# Patient Record
Sex: Female | Born: 1950 | Race: White | Hispanic: No | Marital: Married
Health system: Southern US, Community
[De-identification: ages and names within clinical notes are randomized; demographics above are authoritative.]

---

## 2016-12-24 ENCOUNTER — Inpatient Hospital Stay
Admission: AD | Admit: 2016-12-24 | Discharge: 2017-03-30 | Disposition: E | Payer: Self-pay | Source: Ambulatory Visit | Attending: Internal Medicine | Admitting: Internal Medicine

## 2016-12-24 DIAGNOSIS — R0902 Hypoxemia: Secondary | ICD-10-CM

## 2016-12-24 DIAGNOSIS — Z931 Gastrostomy status: Secondary | ICD-10-CM

## 2016-12-24 DIAGNOSIS — J9690 Respiratory failure, unspecified, unspecified whether with hypoxia or hypercapnia: Secondary | ICD-10-CM

## 2016-12-24 DIAGNOSIS — I509 Heart failure, unspecified: Secondary | ICD-10-CM

## 2016-12-24 DIAGNOSIS — T148XXA Other injury of unspecified body region, initial encounter: Secondary | ICD-10-CM

## 2016-12-24 DIAGNOSIS — Z9911 Dependence on respirator [ventilator] status: Secondary | ICD-10-CM

## 2016-12-24 DIAGNOSIS — J189 Pneumonia, unspecified organism: Secondary | ICD-10-CM

## 2016-12-24 DIAGNOSIS — J90 Pleural effusion, not elsewhere classified: Secondary | ICD-10-CM

## 2016-12-24 DIAGNOSIS — R109 Unspecified abdominal pain: Secondary | ICD-10-CM

## 2016-12-25 ENCOUNTER — Other Ambulatory Visit (HOSPITAL_COMMUNITY): Payer: Self-pay

## 2016-12-25 LAB — COMPREHENSIVE METABOLIC PANEL
ALT: 68 U/L — ABNORMAL HIGH (ref 14–54)
AST: 106 U/L — ABNORMAL HIGH (ref 15–41)
Albumin: 1.7 g/dL — ABNORMAL LOW (ref 3.5–5.0)
Alkaline Phosphatase: 370 U/L — ABNORMAL HIGH (ref 38–126)
BUN: 46 mg/dL — ABNORMAL HIGH (ref 6–20)
CO2: 20 mmol/L — ABNORMAL LOW (ref 22–32)
Calcium: 8.3 mg/dL — ABNORMAL LOW (ref 8.9–10.3)
Chloride: >130 mmol/L (ref 101–111)
Creatinine, Ser: 1.61 mg/dL — ABNORMAL HIGH (ref 0.44–1.00)
GFR calc Af Amer: 37 mL/min — ABNORMAL LOW (ref >60)
GFR calc non Af Amer: 32 mL/min — ABNORMAL LOW (ref >60)
Glucose, Bld: 143 mg/dL — ABNORMAL HIGH (ref 65–99)
Potassium: 3.9 mmol/L (ref 3.5–5.1)
Sodium: 159 mmol/L — ABNORMAL HIGH (ref 135–145)
Total Bilirubin: 0.5 mg/dL (ref 0.3–1.2)
Total Protein: 5.1 g/dL — ABNORMAL LOW (ref 6.5–8.1)

## 2016-12-25 LAB — PROTIME-INR
INR: 1.23
Prothrombin Time: 15.5 seconds — ABNORMAL HIGH (ref 11.4–15.2)

## 2016-12-25 LAB — BASIC METABOLIC PANEL
ANION GAP: 9 (ref 5–15)
BUN: 42 mg/dL — AB (ref 6–20)
CALCIUM: 7.9 mg/dL — AB (ref 8.9–10.3)
CO2: 20 mmol/L — AB (ref 22–32)
CREATININE: 1.48 mg/dL — AB (ref 0.44–1.00)
Chloride: 122 mmol/L — ABNORMAL HIGH (ref 101–111)
GFR calc Af Amer: 41 mL/min — ABNORMAL LOW (ref >60)
GFR, EST NON AFRICAN AMERICAN: 35 mL/min — AB (ref >60)
GLUCOSE: 292 mg/dL — AB (ref 65–99)
Potassium: 3.5 mmol/L (ref 3.5–5.1)
Sodium: 151 mmol/L — ABNORMAL HIGH (ref 135–145)

## 2016-12-25 LAB — CBC
HCT: 29.9 % — ABNORMAL LOW (ref 36.0–46.0)
Hemoglobin: 9 g/dL — ABNORMAL LOW (ref 12.0–15.0)
MCH: 28.9 pg (ref 26.0–34.0)
MCHC: 30.1 g/dL (ref 30.0–36.0)
MCV: 96.1 fL (ref 78.0–100.0)
Platelets: 213 10*3/uL (ref 150–400)
RBC: 3.11 MIL/uL — ABNORMAL LOW (ref 3.87–5.11)
RDW: 18.5 % — ABNORMAL HIGH (ref 11.5–15.5)
WBC: 16.3 10*3/uL — ABNORMAL HIGH (ref 4.0–10.5)

## 2016-12-25 LAB — MAGNESIUM: Magnesium: 2.4 mg/dL (ref 1.7–2.4)

## 2016-12-25 LAB — APTT: aPTT: 30 seconds (ref 24–36)

## 2016-12-26 LAB — CBC
HEMATOCRIT: 29.7 % — AB (ref 36.0–46.0)
Hemoglobin: 8.5 g/dL — ABNORMAL LOW (ref 12.0–15.0)
MCH: 27.4 pg (ref 26.0–34.0)
MCHC: 28.6 g/dL — AB (ref 30.0–36.0)
MCV: 95.8 fL (ref 78.0–100.0)
Platelets: 230 10*3/uL (ref 150–400)
RBC: 3.1 MIL/uL — ABNORMAL LOW (ref 3.87–5.11)
RDW: 18.2 % — AB (ref 11.5–15.5)
WBC: 15.3 10*3/uL — ABNORMAL HIGH (ref 4.0–10.5)

## 2016-12-26 LAB — BASIC METABOLIC PANEL
Anion gap: 4 — ABNORMAL LOW (ref 5–15)
BUN: 41 mg/dL — ABNORMAL HIGH (ref 6–20)
BUN: 36 mg/dL — AB (ref 6–20)
CALCIUM: 8.1 mg/dL — AB (ref 8.9–10.3)
CALCIUM: 7.8 mg/dL — AB (ref 8.9–10.3)
CHLORIDE: 129 mmol/L — AB (ref 101–111)
CO2: 21 mmol/L — AB (ref 22–32)
CO2: 21 mmol/L — ABNORMAL LOW (ref 22–32)
CREATININE: 1.35 mg/dL — AB (ref 0.44–1.00)
CREATININE: 1.29 mg/dL — AB (ref 0.44–1.00)
GFR calc non Af Amer: 40 mL/min — ABNORMAL LOW (ref >60)
GFR calc non Af Amer: 42 mL/min — ABNORMAL LOW (ref >60)
GFR, EST AFRICAN AMERICAN: 46 mL/min — AB (ref >60)
GFR, EST AFRICAN AMERICAN: 49 mL/min — AB (ref >60)
GLUCOSE: 158 mg/dL — AB (ref 65–99)
Glucose, Bld: 202 mg/dL — ABNORMAL HIGH (ref 65–99)
Potassium: 3.4 mmol/L — ABNORMAL LOW (ref 3.5–5.1)
Potassium: 3.6 mmol/L (ref 3.5–5.1)
Sodium: 159 mmol/L — ABNORMAL HIGH (ref 135–145)
Sodium: 154 mmol/L — ABNORMAL HIGH (ref 135–145)

## 2016-12-27 LAB — BASIC METABOLIC PANEL
ANION GAP: 6 (ref 5–15)
ANION GAP: 6 (ref 5–15)
BUN: 39 mg/dL — ABNORMAL HIGH (ref 6–20)
BUN: 37 mg/dL — AB (ref 6–20)
CHLORIDE: 126 mmol/L — AB (ref 101–111)
CO2: 21 mmol/L — AB (ref 22–32)
CO2: 22 mmol/L (ref 22–32)
Calcium: 7.9 mg/dL — ABNORMAL LOW (ref 8.9–10.3)
Calcium: 7.8 mg/dL — ABNORMAL LOW (ref 8.9–10.3)
Chloride: 127 mmol/L — ABNORMAL HIGH (ref 101–111)
Creatinine, Ser: 1.34 mg/dL — ABNORMAL HIGH (ref 0.44–1.00)
Creatinine, Ser: 1.39 mg/dL — ABNORMAL HIGH (ref 0.44–1.00)
GFR calc Af Amer: 46 mL/min — ABNORMAL LOW (ref >60)
GFR calc Af Amer: 44 mL/min — ABNORMAL LOW (ref >60)
GFR calc non Af Amer: 40 mL/min — ABNORMAL LOW (ref >60)
GFR calc non Af Amer: 38 mL/min — ABNORMAL LOW (ref >60)
GLUCOSE: 221 mg/dL — AB (ref 65–99)
GLUCOSE: 337 mg/dL — AB (ref 65–99)
POTASSIUM: 3.4 mmol/L — AB (ref 3.5–5.1)
POTASSIUM: 3.6 mmol/L (ref 3.5–5.1)
Sodium: 154 mmol/L — ABNORMAL HIGH (ref 135–145)
Sodium: 154 mmol/L — ABNORMAL HIGH (ref 135–145)

## 2016-12-27 LAB — URINALYSIS, ROUTINE W REFLEX MICROSCOPIC
Bilirubin Urine: NEGATIVE
Hgb urine dipstick: NEGATIVE
KETONES UR: NEGATIVE mg/dL
LEUKOCYTES UA: NEGATIVE
Nitrite: NEGATIVE
PROTEIN: 30 mg/dL — AB
Specific Gravity, Urine: 1.015 (ref 1.005–1.030)
pH: 5 (ref 5.0–8.0)

## 2016-12-28 LAB — BASIC METABOLIC PANEL
ANION GAP: 4 — AB (ref 5–15)
BUN: 32 mg/dL — ABNORMAL HIGH (ref 6–20)
CALCIUM: 7.3 mg/dL — AB (ref 8.9–10.3)
CHLORIDE: 116 mmol/L — AB (ref 101–111)
CO2: 23 mmol/L (ref 22–32)
CREATININE: 1.24 mg/dL — AB (ref 0.44–1.00)
GFR calc Af Amer: 51 mL/min — ABNORMAL LOW (ref >60)
GFR calc non Af Amer: 44 mL/min — ABNORMAL LOW (ref >60)
GLUCOSE: 547 mg/dL — AB (ref 65–99)
Potassium: 3.3 mmol/L — ABNORMAL LOW (ref 3.5–5.1)
Sodium: 143 mmol/L (ref 135–145)

## 2016-12-28 LAB — C DIFFICILE QUICK SCREEN W PCR REFLEX
C DIFFICILE (CDIFF) TOXIN: NEGATIVE
C DIFFICLE (CDIFF) ANTIGEN: POSITIVE — AB

## 2016-12-28 LAB — MAGNESIUM: MAGNESIUM: 2.2 mg/dL (ref 1.7–2.4)

## 2016-12-28 LAB — CBC
HCT: 28.9 % — ABNORMAL LOW (ref 36.0–46.0)
Hemoglobin: 8.3 g/dL — ABNORMAL LOW (ref 12.0–15.0)
MCH: 27.8 pg (ref 26.0–34.0)
MCHC: 28.7 g/dL — AB (ref 30.0–36.0)
MCV: 96.7 fL (ref 78.0–100.0)
PLATELETS: 214 10*3/uL (ref 150–400)
RBC: 2.99 MIL/uL — ABNORMAL LOW (ref 3.87–5.11)
RDW: 18.2 % — ABNORMAL HIGH (ref 11.5–15.5)
WBC: 16.2 10*3/uL — ABNORMAL HIGH (ref 4.0–10.5)

## 2016-12-28 LAB — PHOSPHORUS: Phosphorus: 3.3 mg/dL (ref 2.5–4.6)

## 2016-12-28 LAB — CLOSTRIDIUM DIFFICILE BY PCR: Toxigenic C. Difficile by PCR: NEGATIVE

## 2016-12-28 DEATH — deceased

## 2016-12-29 LAB — BASIC METABOLIC PANEL
Anion gap: 5 (ref 5–15)
BUN: 26 mg/dL — ABNORMAL HIGH (ref 6–20)
CO2: 26 mmol/L (ref 22–32)
CREATININE: 1.2 mg/dL — AB (ref 0.44–1.00)
Calcium: 8.2 mg/dL — ABNORMAL LOW (ref 8.9–10.3)
Chloride: 121 mmol/L — ABNORMAL HIGH (ref 101–111)
GFR calc Af Amer: 53 mL/min — ABNORMAL LOW (ref >60)
GFR, EST NON AFRICAN AMERICAN: 46 mL/min — AB (ref >60)
Glucose, Bld: 233 mg/dL — ABNORMAL HIGH (ref 65–99)
POTASSIUM: 4.1 mmol/L (ref 3.5–5.1)
SODIUM: 152 mmol/L — AB (ref 135–145)

## 2016-12-29 LAB — URINE CULTURE: Culture: 80000 — AB

## 2016-12-30 LAB — BASIC METABOLIC PANEL
ANION GAP: 6 (ref 5–15)
BUN: 24 mg/dL — ABNORMAL HIGH (ref 6–20)
CHLORIDE: 112 mmol/L — AB (ref 101–111)
CO2: 26 mmol/L (ref 22–32)
Calcium: 7.7 mg/dL — ABNORMAL LOW (ref 8.9–10.3)
Creatinine, Ser: 1.19 mg/dL — ABNORMAL HIGH (ref 0.44–1.00)
GFR calc non Af Amer: 46 mL/min — ABNORMAL LOW (ref >60)
GFR, EST AFRICAN AMERICAN: 54 mL/min — AB (ref >60)
GLUCOSE: 216 mg/dL — AB (ref 65–99)
Potassium: 3.8 mmol/L (ref 3.5–5.1)
Sodium: 144 mmol/L (ref 135–145)

## 2016-12-30 LAB — CBC WITH DIFFERENTIAL/PLATELET
Basophils Absolute: 0 10*3/uL (ref 0.0–0.1)
Basophils Relative: 0 %
EOS ABS: 0.4 10*3/uL (ref 0.0–0.7)
Eosinophils Relative: 3 %
HCT: 29.4 % — ABNORMAL LOW (ref 36.0–46.0)
HEMOGLOBIN: 8.4 g/dL — AB (ref 12.0–15.0)
LYMPHS ABS: 1 10*3/uL (ref 0.7–4.0)
LYMPHS PCT: 7 %
MCH: 27.5 pg (ref 26.0–34.0)
MCHC: 28.6 g/dL — AB (ref 30.0–36.0)
MCV: 96.4 fL (ref 78.0–100.0)
Monocytes Absolute: 0.5 10*3/uL (ref 0.1–1.0)
Monocytes Relative: 3 %
NEUTROS ABS: 12.3 10*3/uL — AB (ref 1.7–7.7)
NEUTROS PCT: 87 %
Platelets: 205 10*3/uL (ref 150–400)
RBC: 3.05 MIL/uL — AB (ref 3.87–5.11)
RDW: 17.8 % — ABNORMAL HIGH (ref 11.5–15.5)
WBC: 14.1 10*3/uL — ABNORMAL HIGH (ref 4.0–10.5)

## 2016-12-30 LAB — MAGNESIUM: Magnesium: 2 mg/dL (ref 1.7–2.4)

## 2016-12-30 LAB — PHOSPHORUS: Phosphorus: 3.5 mg/dL (ref 2.5–4.6)

## 2016-12-31 LAB — RENAL FUNCTION PANEL
ALBUMIN: 1.9 g/dL — AB (ref 3.5–5.0)
Anion gap: 6 (ref 5–15)
BUN: 26 mg/dL — ABNORMAL HIGH (ref 6–20)
CALCIUM: 7.7 mg/dL — AB (ref 8.9–10.3)
CO2: 28 mmol/L (ref 22–32)
CREATININE: 1.21 mg/dL — AB (ref 0.44–1.00)
Chloride: 110 mmol/L (ref 101–111)
GFR calc Af Amer: 52 mL/min — ABNORMAL LOW (ref >60)
GFR, EST NON AFRICAN AMERICAN: 45 mL/min — AB (ref >60)
Glucose, Bld: 180 mg/dL — ABNORMAL HIGH (ref 65–99)
PHOSPHORUS: 3.6 mg/dL (ref 2.5–4.6)
Potassium: 3.6 mmol/L (ref 3.5–5.1)
SODIUM: 144 mmol/L (ref 135–145)

## 2016-12-31 LAB — MAGNESIUM: MAGNESIUM: 2 mg/dL (ref 1.7–2.4)

## 2016-12-31 LAB — HEMOGLOBIN A1C
HEMOGLOBIN A1C: 6.3 % — AB (ref 4.8–5.6)
MEAN PLASMA GLUCOSE: 134 mg/dL

## 2017-01-03 ENCOUNTER — Other Ambulatory Visit (HOSPITAL_COMMUNITY): Payer: Self-pay

## 2017-01-03 LAB — CBC WITH DIFFERENTIAL/PLATELET
BASOS ABS: 0.1 10*3/uL (ref 0.0–0.1)
Basophils Relative: 0 %
Eosinophils Absolute: 0 10*3/uL (ref 0.0–0.7)
Eosinophils Relative: 0 %
HEMATOCRIT: 29.2 % — AB (ref 36.0–46.0)
HEMOGLOBIN: 8.9 g/dL — AB (ref 12.0–15.0)
LYMPHS PCT: 7 %
Lymphs Abs: 1 10*3/uL (ref 0.7–4.0)
MCH: 28.7 pg (ref 26.0–34.0)
MCHC: 30.5 g/dL (ref 30.0–36.0)
MCV: 94.2 fL (ref 78.0–100.0)
MONO ABS: 0.5 10*3/uL (ref 0.1–1.0)
MONOS PCT: 4 %
NEUTROS ABS: 12.4 10*3/uL — AB (ref 1.7–7.7)
NEUTROS PCT: 89 %
Platelets: 171 10*3/uL (ref 150–400)
RBC: 3.1 MIL/uL — ABNORMAL LOW (ref 3.87–5.11)
RDW: 18.1 % — AB (ref 11.5–15.5)
WBC: 14 10*3/uL — ABNORMAL HIGH (ref 4.0–10.5)

## 2017-01-03 LAB — RENAL FUNCTION PANEL
ALBUMIN: 2.1 g/dL — AB (ref 3.5–5.0)
ANION GAP: 10 (ref 5–15)
BUN: 25 mg/dL — ABNORMAL HIGH (ref 6–20)
CALCIUM: 7.9 mg/dL — AB (ref 8.9–10.3)
CO2: 31 mmol/L (ref 22–32)
Chloride: 105 mmol/L (ref 101–111)
Creatinine, Ser: 1.1 mg/dL — ABNORMAL HIGH (ref 0.44–1.00)
GFR calc non Af Amer: 51 mL/min — ABNORMAL LOW (ref >60)
GFR, EST AFRICAN AMERICAN: 59 mL/min — AB (ref >60)
GLUCOSE: 158 mg/dL — AB (ref 65–99)
PHOSPHORUS: 4.4 mg/dL (ref 2.5–4.6)
POTASSIUM: 3.9 mmol/L (ref 3.5–5.1)
SODIUM: 146 mmol/L — AB (ref 135–145)

## 2017-01-03 LAB — MAGNESIUM: Magnesium: 2 mg/dL (ref 1.7–2.4)

## 2017-01-04 LAB — BASIC METABOLIC PANEL
Anion gap: 8 (ref 5–15)
BUN: 26 mg/dL — ABNORMAL HIGH (ref 6–20)
CALCIUM: 7.8 mg/dL — AB (ref 8.9–10.3)
CO2: 35 mmol/L — AB (ref 22–32)
CREATININE: 1.17 mg/dL — AB (ref 0.44–1.00)
Chloride: 101 mmol/L (ref 101–111)
GFR calc non Af Amer: 47 mL/min — ABNORMAL LOW (ref >60)
GFR, EST AFRICAN AMERICAN: 55 mL/min — AB (ref >60)
GLUCOSE: 167 mg/dL — AB (ref 65–99)
Potassium: 3.6 mmol/L (ref 3.5–5.1)
SODIUM: 144 mmol/L (ref 135–145)

## 2017-01-05 LAB — BASIC METABOLIC PANEL
Anion gap: 8 (ref 5–15)
BUN: 25 mg/dL — AB (ref 6–20)
CO2: 37 mmol/L — AB (ref 22–32)
Calcium: 7.8 mg/dL — ABNORMAL LOW (ref 8.9–10.3)
Chloride: 99 mmol/L — ABNORMAL LOW (ref 101–111)
Creatinine, Ser: 1.18 mg/dL — ABNORMAL HIGH (ref 0.44–1.00)
GFR calc Af Amer: 54 mL/min — ABNORMAL LOW (ref >60)
GFR, EST NON AFRICAN AMERICAN: 47 mL/min — AB (ref >60)
GLUCOSE: 168 mg/dL — AB (ref 65–99)
Potassium: 3.5 mmol/L (ref 3.5–5.1)
Sodium: 144 mmol/L (ref 135–145)

## 2017-01-05 LAB — CBC
HCT: 29.1 % — ABNORMAL LOW (ref 36.0–46.0)
Hemoglobin: 8.4 g/dL — ABNORMAL LOW (ref 12.0–15.0)
MCH: 28 pg (ref 26.0–34.0)
MCHC: 28.9 g/dL — AB (ref 30.0–36.0)
MCV: 97 fL (ref 78.0–100.0)
Platelets: 163 10*3/uL (ref 150–400)
RBC: 3 MIL/uL — ABNORMAL LOW (ref 3.87–5.11)
RDW: 18 % — AB (ref 11.5–15.5)
WBC: 12.4 10*3/uL — ABNORMAL HIGH (ref 4.0–10.5)

## 2017-01-05 LAB — PHOSPHORUS: Phosphorus: 4.2 mg/dL (ref 2.5–4.6)

## 2017-01-05 LAB — MAGNESIUM: MAGNESIUM: 2.1 mg/dL (ref 1.7–2.4)

## 2017-01-09 LAB — BASIC METABOLIC PANEL
ANION GAP: 8 (ref 5–15)
BUN: 22 mg/dL — ABNORMAL HIGH (ref 6–20)
CHLORIDE: 97 mmol/L — AB (ref 101–111)
CO2: 37 mmol/L — ABNORMAL HIGH (ref 22–32)
Calcium: 8.1 mg/dL — ABNORMAL LOW (ref 8.9–10.3)
Creatinine, Ser: 1.09 mg/dL — ABNORMAL HIGH (ref 0.44–1.00)
GFR calc Af Amer: 59 mL/min — ABNORMAL LOW (ref >60)
GFR, EST NON AFRICAN AMERICAN: 51 mL/min — AB (ref >60)
Glucose, Bld: 159 mg/dL — ABNORMAL HIGH (ref 65–99)
POTASSIUM: 3.7 mmol/L (ref 3.5–5.1)
SODIUM: 142 mmol/L (ref 135–145)

## 2017-01-10 ENCOUNTER — Other Ambulatory Visit (HOSPITAL_COMMUNITY): Payer: Self-pay

## 2017-01-10 LAB — PARATHYROID HORMONE, INTACT (NO CA): PTH: 33 pg/mL (ref 15–65)

## 2017-01-11 LAB — CBC
HCT: 28.6 % — ABNORMAL LOW (ref 36.0–46.0)
Hemoglobin: 8.4 g/dL — ABNORMAL LOW (ref 12.0–15.0)
MCH: 27.7 pg (ref 26.0–34.0)
MCHC: 29.4 g/dL — ABNORMAL LOW (ref 30.0–36.0)
MCV: 94.4 fL (ref 78.0–100.0)
Platelets: 165 10*3/uL (ref 150–400)
RBC: 3.03 MIL/uL — ABNORMAL LOW (ref 3.87–5.11)
RDW: 17.7 % — ABNORMAL HIGH (ref 11.5–15.5)
WBC: 12.7 10*3/uL — ABNORMAL HIGH (ref 4.0–10.5)

## 2017-01-11 LAB — BASIC METABOLIC PANEL WITH GFR
Anion gap: 11 (ref 5–15)
BUN: 25 mg/dL — ABNORMAL HIGH (ref 6–20)
CO2: 36 mmol/L — ABNORMAL HIGH (ref 22–32)
Calcium: 8 mg/dL — ABNORMAL LOW (ref 8.9–10.3)
Chloride: 93 mmol/L — ABNORMAL LOW (ref 101–111)
Creatinine, Ser: 1.25 mg/dL — ABNORMAL HIGH (ref 0.44–1.00)
GFR calc Af Amer: 50 mL/min — ABNORMAL LOW
GFR calc non Af Amer: 43 mL/min — ABNORMAL LOW
Glucose, Bld: 174 mg/dL — ABNORMAL HIGH (ref 65–99)
Potassium: 3.6 mmol/L (ref 3.5–5.1)
Sodium: 140 mmol/L (ref 135–145)

## 2017-01-11 LAB — PHOSPHORUS: Phosphorus: 5.6 mg/dL — ABNORMAL HIGH (ref 2.5–4.6)

## 2017-01-11 LAB — MAGNESIUM: Magnesium: 2.1 mg/dL (ref 1.7–2.4)

## 2017-01-12 LAB — CALCIUM, IONIZED: CALCIUM, IONIZED, SERUM: 4.6 mg/dL (ref 4.5–5.6)

## 2017-01-13 LAB — C DIFFICILE QUICK SCREEN W PCR REFLEX
C DIFFICILE (CDIFF) INTERP: NOT DETECTED
C DIFFICILE (CDIFF) TOXIN: NEGATIVE
C DIFFICLE (CDIFF) ANTIGEN: NEGATIVE

## 2017-01-14 ENCOUNTER — Other Ambulatory Visit (HOSPITAL_COMMUNITY): Payer: Self-pay

## 2017-01-14 LAB — CBC WITH DIFFERENTIAL/PLATELET
Basophils Absolute: 0 10*3/uL (ref 0.0–0.1)
Basophils Relative: 0 %
EOS ABS: 0.5 10*3/uL (ref 0.0–0.7)
Eosinophils Relative: 4 %
HCT: 28.8 % — ABNORMAL LOW (ref 36.0–46.0)
Hemoglobin: 8.3 g/dL — ABNORMAL LOW (ref 12.0–15.0)
LYMPHS ABS: 0.9 10*3/uL (ref 0.7–4.0)
LYMPHS PCT: 8 %
MCH: 27.7 pg (ref 26.0–34.0)
MCHC: 28.8 g/dL — AB (ref 30.0–36.0)
MCV: 96 fL (ref 78.0–100.0)
MONO ABS: 0.7 10*3/uL (ref 0.1–1.0)
MONOS PCT: 7 %
Neutro Abs: 8.5 10*3/uL — ABNORMAL HIGH (ref 1.7–7.7)
Neutrophils Relative %: 81 %
Platelets: 178 10*3/uL (ref 150–400)
RBC: 3 MIL/uL — AB (ref 3.87–5.11)
RDW: 17.3 % — AB (ref 11.5–15.5)
WBC: 10.6 10*3/uL — AB (ref 4.0–10.5)

## 2017-01-14 LAB — RENAL FUNCTION PANEL
Albumin: 2.2 g/dL — ABNORMAL LOW (ref 3.5–5.0)
Anion gap: 9 (ref 5–15)
BUN: 24 mg/dL — AB (ref 6–20)
CHLORIDE: 95 mmol/L — AB (ref 101–111)
CO2: 38 mmol/L — ABNORMAL HIGH (ref 22–32)
CREATININE: 1.22 mg/dL — AB (ref 0.44–1.00)
Calcium: 8.2 mg/dL — ABNORMAL LOW (ref 8.9–10.3)
GFR calc non Af Amer: 44 mL/min — ABNORMAL LOW (ref >60)
GFR, EST AFRICAN AMERICAN: 52 mL/min — AB (ref >60)
GLUCOSE: 139 mg/dL — AB (ref 65–99)
Phosphorus: 5 mg/dL — ABNORMAL HIGH (ref 2.5–4.6)
Potassium: 3.3 mmol/L — ABNORMAL LOW (ref 3.5–5.1)
SODIUM: 142 mmol/L (ref 135–145)

## 2017-01-14 LAB — MAGNESIUM: MAGNESIUM: 2.1 mg/dL (ref 1.7–2.4)

## 2017-01-15 LAB — MAGNESIUM: MAGNESIUM: 2.1 mg/dL (ref 1.7–2.4)

## 2017-01-15 LAB — BASIC METABOLIC PANEL
Anion gap: 8 (ref 5–15)
BUN: 27 mg/dL — ABNORMAL HIGH (ref 6–20)
CALCIUM: 8.3 mg/dL — AB (ref 8.9–10.3)
CO2: 38 mmol/L — AB (ref 22–32)
Chloride: 97 mmol/L — ABNORMAL LOW (ref 101–111)
Creatinine, Ser: 1.15 mg/dL — ABNORMAL HIGH (ref 0.44–1.00)
GFR calc Af Amer: 55 mL/min — ABNORMAL LOW (ref >60)
GFR, EST NON AFRICAN AMERICAN: 48 mL/min — AB (ref >60)
Glucose, Bld: 135 mg/dL — ABNORMAL HIGH (ref 65–99)
Potassium: 3.7 mmol/L (ref 3.5–5.1)
Sodium: 143 mmol/L (ref 135–145)

## 2017-01-15 LAB — PHOSPHORUS: Phosphorus: 4.7 mg/dL — ABNORMAL HIGH (ref 2.5–4.6)

## 2017-01-20 ENCOUNTER — Other Ambulatory Visit (HOSPITAL_BASED_OUTPATIENT_CLINIC_OR_DEPARTMENT_OTHER): Payer: Self-pay

## 2017-01-20 DIAGNOSIS — I342 Nonrheumatic mitral (valve) stenosis: Secondary | ICD-10-CM

## 2017-01-20 NOTE — Progress Notes (Signed)
  Echocardiogram 2D Echocardiogram has been performed.  Tiffany Oliver, Tiffany Oliver 01/20/2017, 2:39 PM

## 2017-01-24 ENCOUNTER — Other Ambulatory Visit (HOSPITAL_COMMUNITY): Payer: Self-pay

## 2017-01-24 LAB — BASIC METABOLIC PANEL
Anion gap: 10 (ref 5–15)
BUN: 27 mg/dL — AB (ref 6–20)
CALCIUM: 8.4 mg/dL — AB (ref 8.9–10.3)
CO2: 37 mmol/L — ABNORMAL HIGH (ref 22–32)
CREATININE: 1.04 mg/dL — AB (ref 0.44–1.00)
Chloride: 97 mmol/L — ABNORMAL LOW (ref 101–111)
GFR calc Af Amer: >60 mL/min (ref >60)
GFR, EST NON AFRICAN AMERICAN: 54 mL/min — AB (ref >60)
Glucose, Bld: 156 mg/dL — ABNORMAL HIGH (ref 65–99)
Potassium: 3.3 mmol/L — ABNORMAL LOW (ref 3.5–5.1)
Sodium: 144 mmol/L (ref 135–145)

## 2017-01-24 LAB — PHOSPHORUS: Phosphorus: 4.5 mg/dL (ref 2.5–4.6)

## 2017-01-24 LAB — MAGNESIUM: Magnesium: 2 mg/dL (ref 1.7–2.4)

## 2017-01-27 ENCOUNTER — Other Ambulatory Visit (HOSPITAL_COMMUNITY): Payer: Self-pay

## 2017-01-27 LAB — LACTATE DEHYDROGENASE, PLEURAL OR PERITONEAL FLUID: LD FL: 78 U/L — AB (ref 3–23)

## 2017-01-27 LAB — BASIC METABOLIC PANEL
ANION GAP: 7 (ref 5–15)
BUN: 23 mg/dL — ABNORMAL HIGH (ref 6–20)
CALCIUM: 8.2 mg/dL — AB (ref 8.9–10.3)
CO2: 37 mmol/L — AB (ref 22–32)
Chloride: 99 mmol/L — ABNORMAL LOW (ref 101–111)
Creatinine, Ser: 0.92 mg/dL (ref 0.44–1.00)
GFR calc Af Amer: >60 mL/min (ref >60)
GFR calc non Af Amer: >60 mL/min (ref >60)
GLUCOSE: 158 mg/dL — AB (ref 65–99)
Potassium: 2.9 mmol/L — ABNORMAL LOW (ref 3.5–5.1)
Sodium: 143 mmol/L (ref 135–145)

## 2017-01-27 LAB — GRAM STAIN

## 2017-01-27 LAB — CBC WITH DIFFERENTIAL/PLATELET
Basophils Absolute: 0 10*3/uL (ref 0.0–0.1)
Basophils Relative: 0 %
Eosinophils Absolute: 0.3 10*3/uL (ref 0.0–0.7)
Eosinophils Relative: 3 %
HEMATOCRIT: 29.1 % — AB (ref 36.0–46.0)
Hemoglobin: 8.5 g/dL — ABNORMAL LOW (ref 12.0–15.0)
LYMPHS PCT: 10 %
Lymphs Abs: 1 10*3/uL (ref 0.7–4.0)
MCH: 27.7 pg (ref 26.0–34.0)
MCHC: 29.2 g/dL — ABNORMAL LOW (ref 30.0–36.0)
MCV: 94.8 fL (ref 78.0–100.0)
MONO ABS: 0.8 10*3/uL (ref 0.1–1.0)
Monocytes Relative: 8 %
NEUTROS ABS: 8.1 10*3/uL — AB (ref 1.7–7.7)
Neutrophils Relative %: 79 %
Platelets: 225 10*3/uL (ref 150–400)
RBC: 3.07 MIL/uL — ABNORMAL LOW (ref 3.87–5.11)
RDW: 16.4 % — AB (ref 11.5–15.5)
WBC: 10.2 10*3/uL (ref 4.0–10.5)

## 2017-01-27 LAB — GLUCOSE, PLEURAL OR PERITONEAL FLUID: GLUCOSE FL: 161 mg/dL

## 2017-01-27 LAB — PROTEIN, PLEURAL OR PERITONEAL FLUID: Total protein, fluid: <3 g/dL

## 2017-01-27 MED ORDER — LIDOCAINE HCL 1 % IJ SOLN
INTRAMUSCULAR | Status: AC
  Filled 2017-01-27: qty 20

## 2017-01-27 NOTE — Procedures (Signed)
   US guided Left thoracentesis PORTABLE  1.1 L dark yellow fluid Tolerated well  CXR pending  Sent for labs per MD

## 2017-01-28 LAB — PH, BODY FLUID: PH, BODY FLUID: 8.1

## 2017-01-29 LAB — ACID FAST SMEAR (AFB, MYCOBACTERIA): Acid Fast Smear: NEGATIVE

## 2017-01-30 LAB — BASIC METABOLIC PANEL
ANION GAP: 7 (ref 5–15)
BUN: 27 mg/dL — ABNORMAL HIGH (ref 6–20)
CHLORIDE: 99 mmol/L — AB (ref 101–111)
CO2: 37 mmol/L — ABNORMAL HIGH (ref 22–32)
CREATININE: 1.01 mg/dL — AB (ref 0.44–1.00)
Calcium: 8.2 mg/dL — ABNORMAL LOW (ref 8.9–10.3)
GFR calc non Af Amer: 56 mL/min — ABNORMAL LOW (ref >60)
Glucose, Bld: 200 mg/dL — ABNORMAL HIGH (ref 65–99)
Potassium: 3.3 mmol/L — ABNORMAL LOW (ref 3.5–5.1)
SODIUM: 143 mmol/L (ref 135–145)

## 2017-01-31 LAB — BASIC METABOLIC PANEL
ANION GAP: 7 (ref 5–15)
BUN: 27 mg/dL — ABNORMAL HIGH (ref 6–20)
CALCIUM: 8.4 mg/dL — AB (ref 8.9–10.3)
CO2: 34 mmol/L — ABNORMAL HIGH (ref 22–32)
CREATININE: 0.82 mg/dL (ref 0.44–1.00)
Chloride: 103 mmol/L (ref 101–111)
GFR calc non Af Amer: >60 mL/min (ref >60)
Glucose, Bld: 135 mg/dL — ABNORMAL HIGH (ref 65–99)
Potassium: 3.6 mmol/L (ref 3.5–5.1)
Sodium: 144 mmol/L (ref 135–145)

## 2017-01-31 LAB — C DIFFICILE QUICK SCREEN W PCR REFLEX
C DIFFICILE (CDIFF) INTERP: NOT DETECTED
C DIFFICILE (CDIFF) TOXIN: NEGATIVE
C DIFFICLE (CDIFF) ANTIGEN: NEGATIVE

## 2017-02-01 LAB — GASTROINTESTINAL PANEL BY PCR, STOOL (REPLACES STOOL CULTURE)

## 2017-02-01 LAB — CULTURE, BODY FLUID W GRAM STAIN -BOTTLE

## 2017-02-01 LAB — CULTURE, BODY FLUID-BOTTLE: CULTURE: NO GROWTH

## 2017-02-07 LAB — CBC
HEMATOCRIT: 28.2 % — AB (ref 36.0–46.0)
HEMOGLOBIN: 8.4 g/dL — AB (ref 12.0–15.0)
MCH: 27.3 pg (ref 26.0–34.0)
MCHC: 29.8 g/dL — AB (ref 30.0–36.0)
MCV: 91.6 fL (ref 78.0–100.0)
Platelets: 234 10*3/uL (ref 150–400)
RBC: 3.08 MIL/uL — ABNORMAL LOW (ref 3.87–5.11)
RDW: 15.8 % — ABNORMAL HIGH (ref 11.5–15.5)
WBC: 12.9 10*3/uL — ABNORMAL HIGH (ref 4.0–10.5)

## 2017-02-07 LAB — BASIC METABOLIC PANEL
Anion gap: 8 (ref 5–15)
BUN: 21 mg/dL — AB (ref 6–20)
CHLORIDE: 97 mmol/L — AB (ref 101–111)
CO2: 34 mmol/L — AB (ref 22–32)
CREATININE: 0.81 mg/dL (ref 0.44–1.00)
Calcium: 8.6 mg/dL — ABNORMAL LOW (ref 8.9–10.3)
GFR calc Af Amer: >60 mL/min (ref >60)
GFR calc non Af Amer: >60 mL/min (ref >60)
GLUCOSE: 141 mg/dL — AB (ref 65–99)
Potassium: 3.2 mmol/L — ABNORMAL LOW (ref 3.5–5.1)
Sodium: 139 mmol/L (ref 135–145)

## 2017-02-08 ENCOUNTER — Other Ambulatory Visit (HOSPITAL_COMMUNITY): Payer: Self-pay

## 2017-02-08 LAB — URINALYSIS, ROUTINE W REFLEX MICROSCOPIC
BILIRUBIN URINE: NEGATIVE
Glucose, UA: NEGATIVE mg/dL
Hgb urine dipstick: NEGATIVE
KETONES UR: NEGATIVE mg/dL
Nitrite: NEGATIVE
PH: 8 (ref 5.0–8.0)
Protein, ur: NEGATIVE mg/dL
SPECIFIC GRAVITY, URINE: 1.015 (ref 1.005–1.030)
Squamous Epithelial / LPF: NONE SEEN

## 2017-02-08 LAB — CBC WITH DIFFERENTIAL/PLATELET
BASOS ABS: 0.1 10*3/uL (ref 0.0–0.1)
Basophils Relative: 0 %
Eosinophils Absolute: 0.3 10*3/uL (ref 0.0–0.7)
Eosinophils Relative: 2 %
HEMATOCRIT: 28.7 % — AB (ref 36.0–46.0)
HEMOGLOBIN: 8.7 g/dL — AB (ref 12.0–15.0)
LYMPHS PCT: 6 %
Lymphs Abs: 1.1 10*3/uL (ref 0.7–4.0)
MCH: 27.8 pg (ref 26.0–34.0)
MCHC: 30.3 g/dL (ref 30.0–36.0)
MCV: 91.7 fL (ref 78.0–100.0)
MONO ABS: 1.1 10*3/uL — AB (ref 0.1–1.0)
Monocytes Relative: 6 %
NEUTROS ABS: 14.4 10*3/uL — AB (ref 1.7–7.7)
NEUTROS PCT: 85 %
Platelets: 215 10*3/uL (ref 150–400)
RBC: 3.13 MIL/uL — AB (ref 3.87–5.11)
RDW: 15.9 % — AB (ref 11.5–15.5)
WBC: 16.9 10*3/uL — ABNORMAL HIGH (ref 4.0–10.5)

## 2017-02-08 LAB — RENAL FUNCTION PANEL
ALBUMIN: 2.3 g/dL — AB (ref 3.5–5.0)
ANION GAP: 9 (ref 5–15)
BUN: 20 mg/dL (ref 6–20)
CHLORIDE: 99 mmol/L — AB (ref 101–111)
CO2: 32 mmol/L (ref 22–32)
Calcium: 8.6 mg/dL — ABNORMAL LOW (ref 8.9–10.3)
Creatinine, Ser: 0.73 mg/dL (ref 0.44–1.00)
Glucose, Bld: 132 mg/dL — ABNORMAL HIGH (ref 65–99)
POTASSIUM: 4.1 mmol/L (ref 3.5–5.1)
Phosphorus: 4.4 mg/dL (ref 2.5–4.6)
Sodium: 140 mmol/L (ref 135–145)

## 2017-02-08 LAB — MAGNESIUM: MAGNESIUM: 1.8 mg/dL (ref 1.7–2.4)

## 2017-02-09 LAB — BASIC METABOLIC PANEL
ANION GAP: 10 (ref 5–15)
BUN: 20 mg/dL (ref 6–20)
CALCIUM: 8.2 mg/dL — AB (ref 8.9–10.3)
CO2: 31 mmol/L (ref 22–32)
Chloride: 97 mmol/L — ABNORMAL LOW (ref 101–111)
Creatinine, Ser: 0.81 mg/dL (ref 0.44–1.00)
GFR calc Af Amer: >60 mL/min (ref >60)
GFR calc non Af Amer: >60 mL/min (ref >60)
GLUCOSE: 145 mg/dL — AB (ref 65–99)
Potassium: 3.5 mmol/L (ref 3.5–5.1)
Sodium: 138 mmol/L (ref 135–145)

## 2017-02-09 LAB — CBC
HEMATOCRIT: 29.3 % — AB (ref 36.0–46.0)
Hemoglobin: 8.6 g/dL — ABNORMAL LOW (ref 12.0–15.0)
MCH: 27.1 pg (ref 26.0–34.0)
MCHC: 29.4 g/dL — AB (ref 30.0–36.0)
MCV: 92.4 fL (ref 78.0–100.0)
Platelets: 230 10*3/uL (ref 150–400)
RBC: 3.17 MIL/uL — ABNORMAL LOW (ref 3.87–5.11)
RDW: 15.9 % — AB (ref 11.5–15.5)
WBC: 14.6 10*3/uL — ABNORMAL HIGH (ref 4.0–10.5)

## 2017-02-09 LAB — PHOSPHORUS: PHOSPHORUS: 5.2 mg/dL — AB (ref 2.5–4.6)

## 2017-02-09 LAB — MAGNESIUM: Magnesium: 1.9 mg/dL (ref 1.7–2.4)

## 2017-02-10 LAB — CULTURE, RESPIRATORY W GRAM STAIN

## 2017-02-10 LAB — BASIC METABOLIC PANEL
Anion gap: 8 (ref 5–15)
BUN: 22 mg/dL — ABNORMAL HIGH (ref 6–20)
CO2: 33 mmol/L — ABNORMAL HIGH (ref 22–32)
CREATININE: 0.86 mg/dL (ref 0.44–1.00)
Calcium: 8.5 mg/dL — ABNORMAL LOW (ref 8.9–10.3)
Chloride: 99 mmol/L — ABNORMAL LOW (ref 101–111)
GFR calc Af Amer: >60 mL/min (ref >60)
GLUCOSE: 145 mg/dL — AB (ref 65–99)
Potassium: 3.9 mmol/L (ref 3.5–5.1)
SODIUM: 140 mmol/L (ref 135–145)

## 2017-02-10 LAB — CULTURE, RESPIRATORY

## 2017-02-10 LAB — URINE CULTURE: CULTURE: NO GROWTH

## 2017-02-12 LAB — CBC WITH DIFFERENTIAL/PLATELET
BASOS ABS: 0.1 10*3/uL (ref 0.0–0.1)
BASOS PCT: 0 %
EOS PCT: 0 %
Eosinophils Absolute: 0 10*3/uL (ref 0.0–0.7)
HCT: 29 % — ABNORMAL LOW (ref 36.0–46.0)
Hemoglobin: 8.7 g/dL — ABNORMAL LOW (ref 12.0–15.0)
LYMPHS PCT: 6 %
Lymphs Abs: 1 10*3/uL (ref 0.7–4.0)
MCH: 27.7 pg (ref 26.0–34.0)
MCHC: 30 g/dL (ref 30.0–36.0)
MCV: 92.4 fL (ref 78.0–100.0)
Monocytes Absolute: 1.1 10*3/uL — ABNORMAL HIGH (ref 0.1–1.0)
Monocytes Relative: 6 %
NEUTROS ABS: 16.9 10*3/uL — AB (ref 1.7–7.7)
Neutrophils Relative %: 88 %
PLATELETS: 272 10*3/uL (ref 150–400)
RBC: 3.14 MIL/uL — AB (ref 3.87–5.11)
RDW: 16.5 % — ABNORMAL HIGH (ref 11.5–15.5)
WBC: 19.1 10*3/uL — AB (ref 4.0–10.5)

## 2017-02-12 LAB — RENAL FUNCTION PANEL
ALBUMIN: 2.6 g/dL — AB (ref 3.5–5.0)
Anion gap: 11 (ref 5–15)
BUN: 20 mg/dL (ref 6–20)
CALCIUM: 8.6 mg/dL — AB (ref 8.9–10.3)
CHLORIDE: 98 mmol/L — AB (ref 101–111)
CO2: 30 mmol/L (ref 22–32)
CREATININE: 0.84 mg/dL (ref 0.44–1.00)
Glucose, Bld: 150 mg/dL — ABNORMAL HIGH (ref 65–99)
PHOSPHORUS: 4.6 mg/dL (ref 2.5–4.6)
Potassium: 3.7 mmol/L (ref 3.5–5.1)
SODIUM: 139 mmol/L (ref 135–145)

## 2017-02-12 LAB — MAGNESIUM: MAGNESIUM: 1.9 mg/dL (ref 1.7–2.4)

## 2017-02-14 LAB — MAGNESIUM: Magnesium: 2 mg/dL (ref 1.7–2.4)

## 2017-02-14 LAB — CBC
HEMATOCRIT: 28.1 % — AB (ref 36.0–46.0)
Hemoglobin: 8.4 g/dL — ABNORMAL LOW (ref 12.0–15.0)
MCH: 27.6 pg (ref 26.0–34.0)
MCHC: 29.9 g/dL — ABNORMAL LOW (ref 30.0–36.0)
MCV: 92.4 fL (ref 78.0–100.0)
PLATELETS: 248 10*3/uL (ref 150–400)
RBC: 3.04 MIL/uL — ABNORMAL LOW (ref 3.87–5.11)
RDW: 16.6 % — AB (ref 11.5–15.5)
WBC: 15.9 10*3/uL — AB (ref 4.0–10.5)

## 2017-02-14 LAB — RENAL FUNCTION PANEL
ALBUMIN: 2.4 g/dL — AB (ref 3.5–5.0)
ANION GAP: 8 (ref 5–15)
BUN: 19 mg/dL (ref 6–20)
CALCIUM: 8.5 mg/dL — AB (ref 8.9–10.3)
CO2: 33 mmol/L — ABNORMAL HIGH (ref 22–32)
Chloride: 98 mmol/L — ABNORMAL LOW (ref 101–111)
Creatinine, Ser: 0.82 mg/dL (ref 0.44–1.00)
GFR calc Af Amer: >60 mL/min (ref >60)
GFR calc non Af Amer: >60 mL/min (ref >60)
GLUCOSE: 130 mg/dL — AB (ref 65–99)
PHOSPHORUS: 5.1 mg/dL — AB (ref 2.5–4.6)
POTASSIUM: 3.9 mmol/L (ref 3.5–5.1)
Sodium: 139 mmol/L (ref 135–145)

## 2017-02-17 ENCOUNTER — Other Ambulatory Visit (HOSPITAL_COMMUNITY): Payer: Self-pay

## 2017-02-18 LAB — BASIC METABOLIC PANEL
ANION GAP: 7 (ref 5–15)
BUN: 24 mg/dL — ABNORMAL HIGH (ref 6–20)
CO2: 38 mmol/L — AB (ref 22–32)
Calcium: 8.7 mg/dL — ABNORMAL LOW (ref 8.9–10.3)
Chloride: 97 mmol/L — ABNORMAL LOW (ref 101–111)
Creatinine, Ser: 0.81 mg/dL (ref 0.44–1.00)
GFR calc Af Amer: >60 mL/min (ref >60)
GFR calc non Af Amer: >60 mL/min (ref >60)
Glucose, Bld: 146 mg/dL — ABNORMAL HIGH (ref 65–99)
POTASSIUM: 4 mmol/L (ref 3.5–5.1)
Sodium: 142 mmol/L (ref 135–145)

## 2017-02-18 LAB — CBC
HEMATOCRIT: 28.4 % — AB (ref 36.0–46.0)
HEMOGLOBIN: 8.2 g/dL — AB (ref 12.0–15.0)
MCH: 27.3 pg (ref 26.0–34.0)
MCHC: 28.9 g/dL — ABNORMAL LOW (ref 30.0–36.0)
MCV: 94.7 fL (ref 78.0–100.0)
Platelets: 207 10*3/uL (ref 150–400)
RBC: 3 MIL/uL — AB (ref 3.87–5.11)
RDW: 16.6 % — ABNORMAL HIGH (ref 11.5–15.5)
WBC: 18 10*3/uL — AB (ref 4.0–10.5)

## 2017-02-18 LAB — MAGNESIUM: Magnesium: 2.1 mg/dL (ref 1.7–2.4)

## 2017-02-18 LAB — PHOSPHORUS: PHOSPHORUS: 4.1 mg/dL (ref 2.5–4.6)

## 2017-02-20 LAB — BLOOD GAS, ARTERIAL
ACID-BASE EXCESS: 14.3 mmol/L — AB (ref 0.0–2.0)
BICARBONATE: 40.1 mmol/L — AB (ref 20.0–28.0)
Drawn by: 244851
FIO2: 70
LHR: 12 {breaths}/min
O2 SAT: 93.3 %
PATIENT TEMPERATURE: 98.6
PCO2 ART: 70.2 mmHg — AB (ref 32.0–48.0)
PEEP/CPAP: 5 cmH2O
PRESSURE CONTROL: 24 cmH2O
pH, Arterial: 7.375 (ref 7.350–7.450)
pO2, Arterial: 71.3 mmHg — ABNORMAL LOW (ref 83.0–108.0)

## 2017-02-22 LAB — CBC
HCT: 26 % — ABNORMAL LOW (ref 36.0–46.0)
HEMOGLOBIN: 7.4 g/dL — AB (ref 12.0–15.0)
MCH: 26.7 pg (ref 26.0–34.0)
MCHC: 28.5 g/dL — ABNORMAL LOW (ref 30.0–36.0)
MCV: 93.9 fL (ref 78.0–100.0)
Platelets: 210 10*3/uL (ref 150–400)
RBC: 2.77 MIL/uL — AB (ref 3.87–5.11)
RDW: 16.8 % — ABNORMAL HIGH (ref 11.5–15.5)
WBC: 13.8 10*3/uL — ABNORMAL HIGH (ref 4.0–10.5)

## 2017-02-22 LAB — BASIC METABOLIC PANEL
Anion gap: 12 (ref 5–15)
BUN: 37 mg/dL — AB (ref 6–20)
CHLORIDE: 96 mmol/L — AB (ref 101–111)
CO2: 37 mmol/L — ABNORMAL HIGH (ref 22–32)
Calcium: 8.5 mg/dL — ABNORMAL LOW (ref 8.9–10.3)
Creatinine, Ser: 0.85 mg/dL (ref 0.44–1.00)
GFR calc Af Amer: >60 mL/min (ref >60)
GFR calc non Af Amer: >60 mL/min (ref >60)
GLUCOSE: 123 mg/dL — AB (ref 65–99)
POTASSIUM: 3.9 mmol/L (ref 3.5–5.1)
Sodium: 145 mmol/L (ref 135–145)

## 2017-03-02 LAB — CBC
HCT: 27.2 % — ABNORMAL LOW (ref 36.0–46.0)
Hemoglobin: 7.9 g/dL — ABNORMAL LOW (ref 12.0–15.0)
MCH: 26.6 pg (ref 26.0–34.0)
MCHC: 29 g/dL — AB (ref 30.0–36.0)
MCV: 91.6 fL (ref 78.0–100.0)
PLATELETS: 307 10*3/uL (ref 150–400)
RBC: 2.97 MIL/uL — ABNORMAL LOW (ref 3.87–5.11)
RDW: 16.7 % — AB (ref 11.5–15.5)
WBC: 16.9 10*3/uL — AB (ref 4.0–10.5)

## 2017-03-02 LAB — BASIC METABOLIC PANEL
Anion gap: 9 (ref 5–15)
BUN: 49 mg/dL — AB (ref 6–20)
CALCIUM: 8.3 mg/dL — AB (ref 8.9–10.3)
CO2: 43 mmol/L — ABNORMAL HIGH (ref 22–32)
CREATININE: 1.4 mg/dL — AB (ref 0.44–1.00)
Chloride: 88 mmol/L — ABNORMAL LOW (ref 101–111)
GFR calc Af Amer: 44 mL/min — ABNORMAL LOW (ref >60)
GFR, EST NON AFRICAN AMERICAN: 38 mL/min — AB (ref >60)
Glucose, Bld: 186 mg/dL — ABNORMAL HIGH (ref 65–99)
Potassium: 2.4 mmol/L — CL (ref 3.5–5.1)
Sodium: 140 mmol/L (ref 135–145)

## 2017-03-03 LAB — BASIC METABOLIC PANEL
Anion gap: 9 (ref 5–15)
BUN: 51 mg/dL — ABNORMAL HIGH (ref 6–20)
CHLORIDE: 90 mmol/L — AB (ref 101–111)
CO2: 42 mmol/L — ABNORMAL HIGH (ref 22–32)
CREATININE: 1.29 mg/dL — AB (ref 0.44–1.00)
Calcium: 8.4 mg/dL — ABNORMAL LOW (ref 8.9–10.3)
GFR calc Af Amer: 48 mL/min — ABNORMAL LOW (ref >60)
GFR, EST NON AFRICAN AMERICAN: 42 mL/min — AB (ref >60)
Glucose, Bld: 128 mg/dL — ABNORMAL HIGH (ref 65–99)
Potassium: 3.6 mmol/L (ref 3.5–5.1)
SODIUM: 141 mmol/L (ref 135–145)

## 2017-03-03 LAB — MAGNESIUM: MAGNESIUM: 2.1 mg/dL (ref 1.7–2.4)

## 2017-03-05 LAB — URINALYSIS, ROUTINE W REFLEX MICROSCOPIC
Bilirubin Urine: NEGATIVE
Glucose, UA: NEGATIVE mg/dL
HGB URINE DIPSTICK: NEGATIVE
Ketones, ur: NEGATIVE mg/dL
LEUKOCYTES UA: NEGATIVE
NITRITE: NEGATIVE
PROTEIN: NEGATIVE mg/dL
Specific Gravity, Urine: 1.013 (ref 1.005–1.030)
pH: 5 (ref 5.0–8.0)

## 2017-03-05 LAB — BASIC METABOLIC PANEL
ANION GAP: 13 (ref 5–15)
BUN: 59 mg/dL — AB (ref 6–20)
CO2: 40 mmol/L — ABNORMAL HIGH (ref 22–32)
Calcium: 8.4 mg/dL — ABNORMAL LOW (ref 8.9–10.3)
Chloride: 88 mmol/L — ABNORMAL LOW (ref 101–111)
Creatinine, Ser: 1.41 mg/dL — ABNORMAL HIGH (ref 0.44–1.00)
GFR, EST AFRICAN AMERICAN: 43 mL/min — AB (ref >60)
GFR, EST NON AFRICAN AMERICAN: 37 mL/min — AB (ref >60)
Glucose, Bld: 149 mg/dL — ABNORMAL HIGH (ref 65–99)
POTASSIUM: 3 mmol/L — AB (ref 3.5–5.1)
SODIUM: 141 mmol/L (ref 135–145)

## 2017-03-06 LAB — BASIC METABOLIC PANEL
Anion gap: 10 (ref 5–15)
BUN: 63 mg/dL — AB (ref 6–20)
CALCIUM: 8.5 mg/dL — AB (ref 8.9–10.3)
CHLORIDE: 86 mmol/L — AB (ref 101–111)
CO2: 43 mmol/L — AB (ref 22–32)
CREATININE: 1.73 mg/dL — AB (ref 0.44–1.00)
GFR calc Af Amer: 34 mL/min — ABNORMAL LOW (ref >60)
GFR calc non Af Amer: 29 mL/min — ABNORMAL LOW (ref >60)
GLUCOSE: 166 mg/dL — AB (ref 65–99)
Potassium: 3.4 mmol/L — ABNORMAL LOW (ref 3.5–5.1)
Sodium: 139 mmol/L (ref 135–145)

## 2017-03-06 LAB — CBC
HCT: 24.5 % — ABNORMAL LOW (ref 36.0–46.0)
Hemoglobin: 7.2 g/dL — ABNORMAL LOW (ref 12.0–15.0)
MCH: 26 pg (ref 26.0–34.0)
MCHC: 29.4 g/dL — ABNORMAL LOW (ref 30.0–36.0)
MCV: 88.4 fL (ref 78.0–100.0)
PLATELETS: 312 10*3/uL (ref 150–400)
RBC: 2.77 MIL/uL — AB (ref 3.87–5.11)
RDW: 16.5 % — ABNORMAL HIGH (ref 11.5–15.5)
WBC: 15.7 10*3/uL — ABNORMAL HIGH (ref 4.0–10.5)

## 2017-03-14 LAB — ACID FAST CULTURE WITH REFLEXED SENSITIVITIES (MYCOBACTERIA): Acid Fast Culture: NEGATIVE

## 2017-03-30 DEATH — deceased

## 2017-09-21 IMAGING — US US THORACENTESIS ASP PLEURAL SPACE W/IMG GUIDE
1 series · 6 of 6 positions shown · non-contrast
Comparison: None.

MEDICATIONS:
10 cc 1% lidocaine

COMPLICATIONS:
None immediate.

INDICATION: Symptomatic left sided pleural effusion

EXAM:
US THORACENTESIS ASP PLEURAL SPACE W/IMG GUIDE
TECHNIQUE: Informed written consent was obtained from the patient after a
discussion of the risks, benefits and alternatives to treatment. A
timeout was performed prior to the initiation of the procedure.

[Series 1: us thoracentesis asp pleural space w/img guide · 0.26mm/px · 6 of 6 slices shown]
[im 1/6]
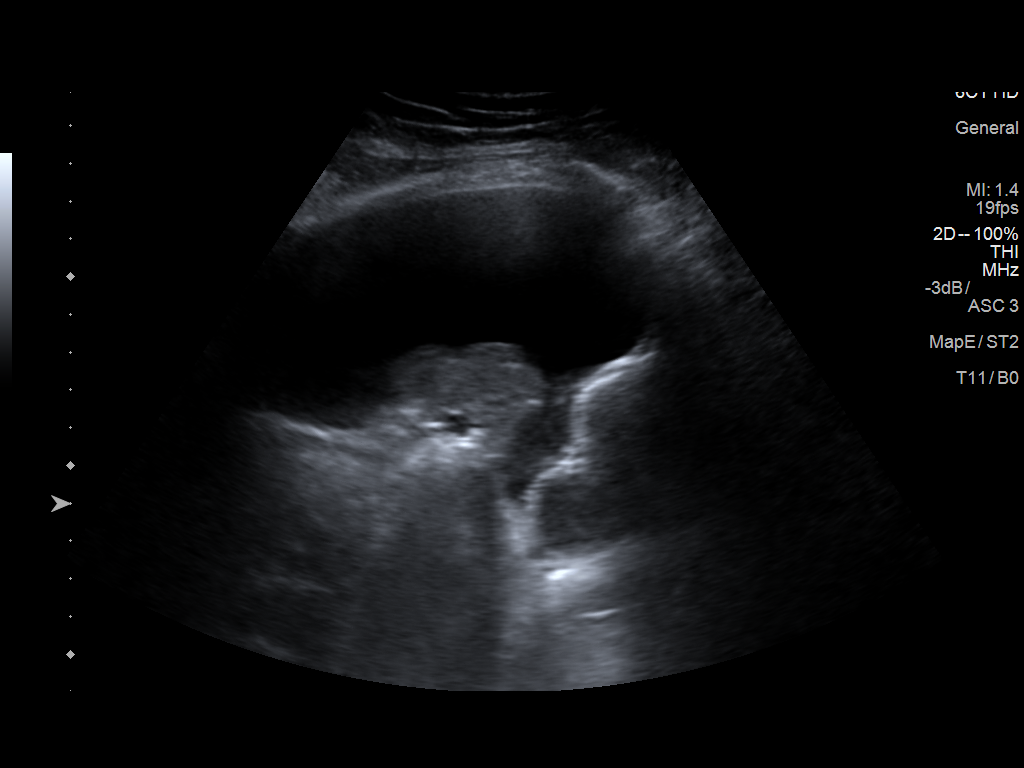
[im 2/6]
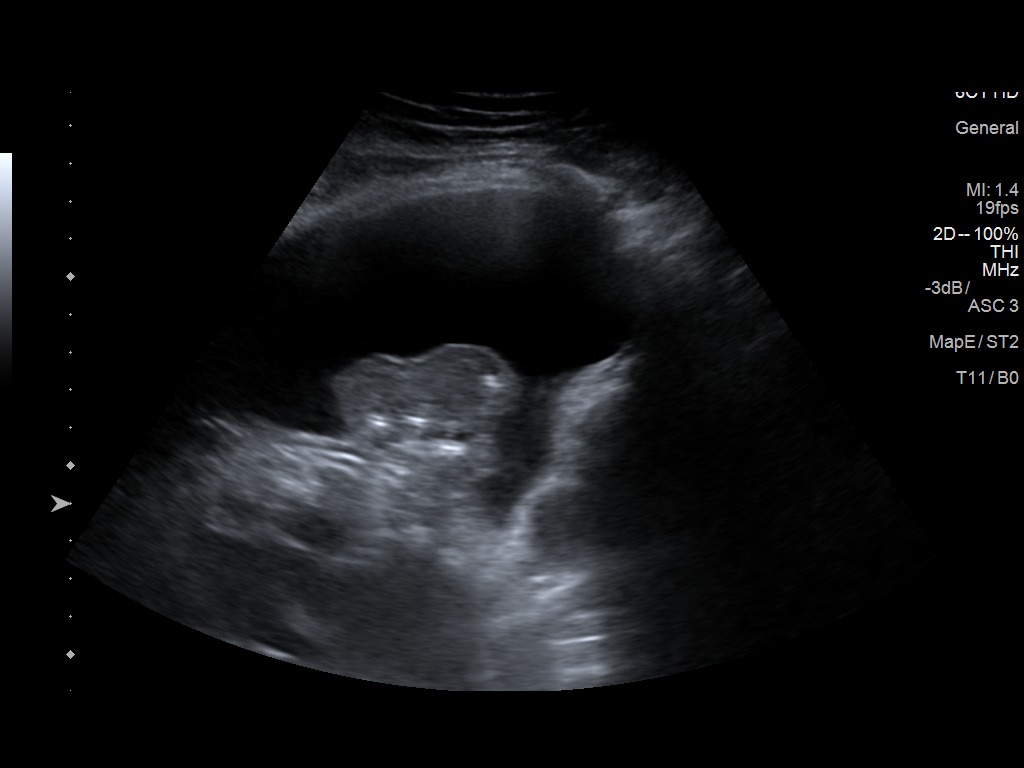
[im 3/6]
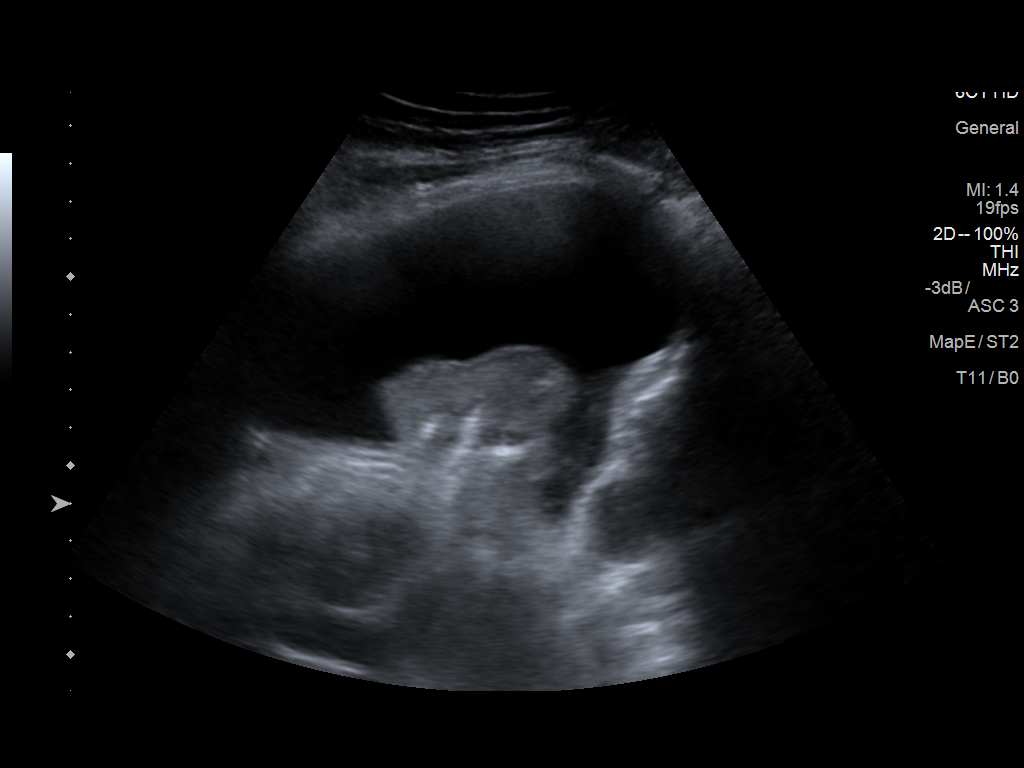
[im 4/6]
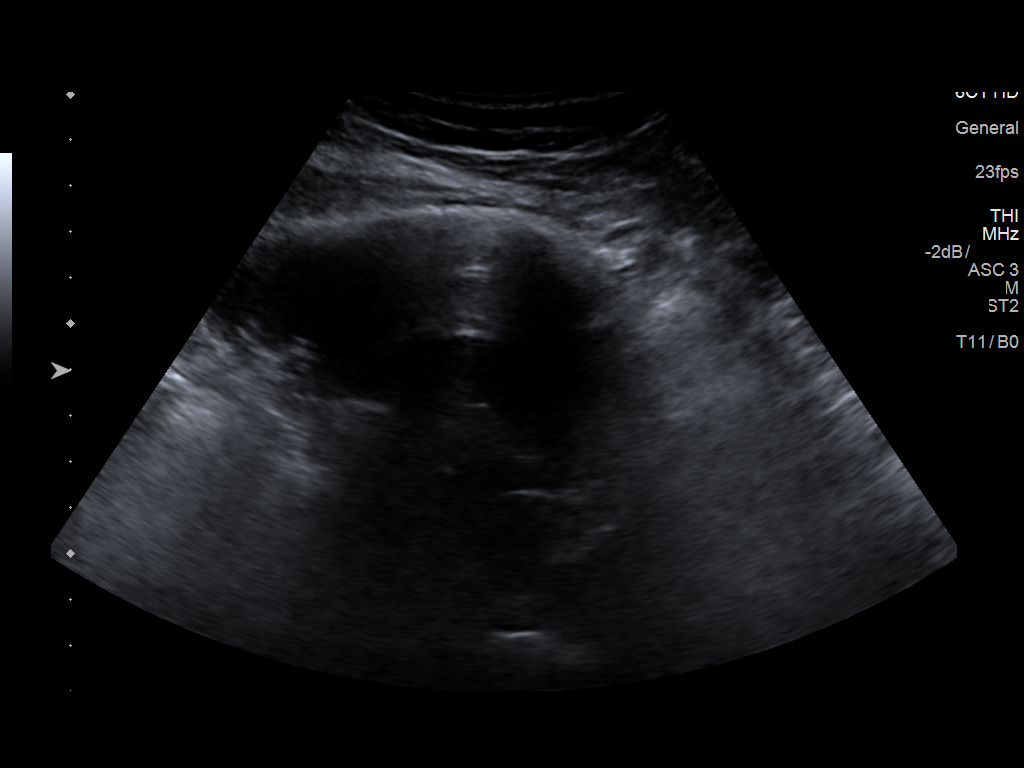
[im 5/6]
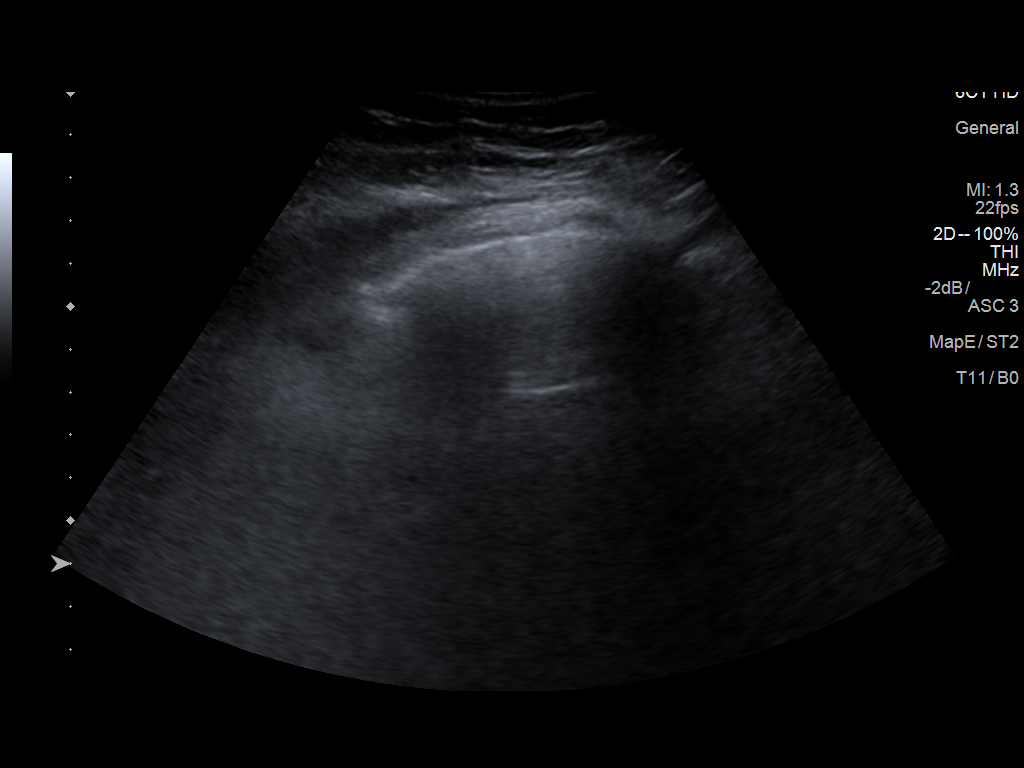
[im 6/6]
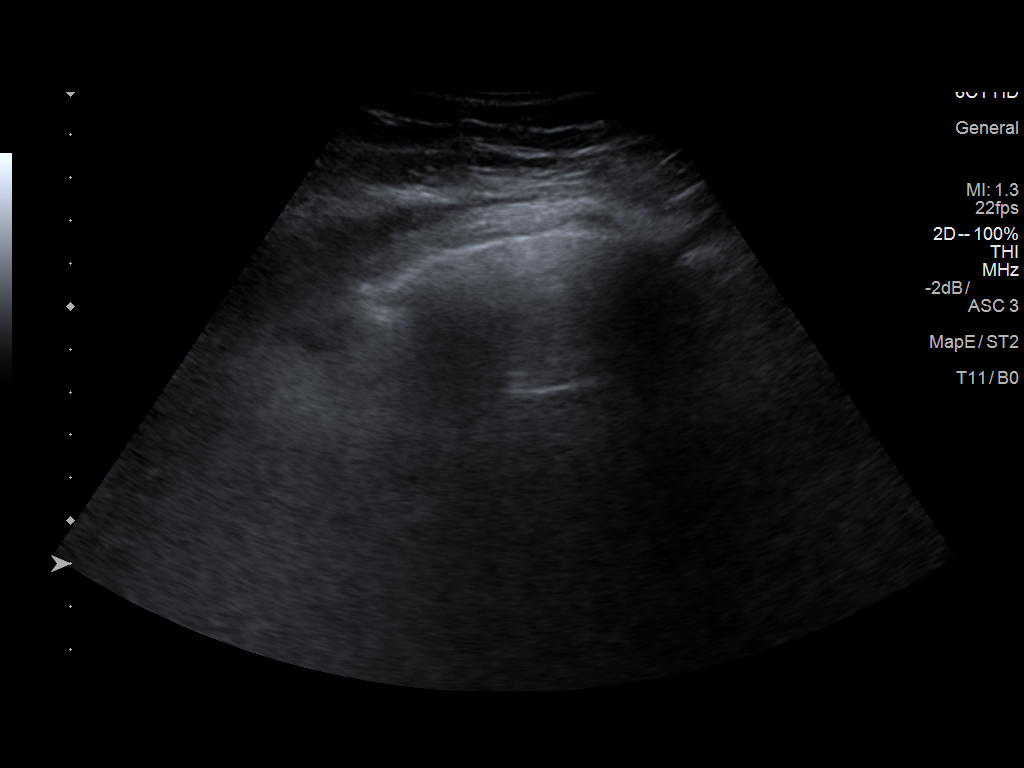

[6 of 6 positions shown; findings below may reference images not displayed]

Initial ultrasound scanning demonstrates a left pleural effusion.
The lower chest was prepped and draped in the usual sterile fashion.
1% lidocaine was used for local anesthesia.

Under direct ultrasound guidance, a 19 gauge, 7-cm, Yueh catheter
was introduced. An ultrasound image was saved for documentation
purposes. The thoracentesis was performed. The catheter was removed
and a dressing was applied. The patient tolerated the procedure well
without immediate post procedural complication. The patient was
escorted to have an upright chest radiograph.
FINDINGS: A total of approximately 1.1 liters of dark yellow fluid was
removed. Requested samples were sent to the laboratory.
IMPRESSION: Successful ultrasound-guided PORTABLE left sided thoracentesis
yielding 1.1 liters of pleural fluid.

Read by

Dev Ayllon

## 2018-08-15 IMAGING — CT CT CERVICAL SPINE W/O CM
3 of 4 series · 13 of 33 positions shown, 16 images · non-contrast
Comparison: None.

CLINICAL DATA: Fall, assess for fracture.

EXAM:
CT CERVICAL SPINE WITHOUT CONTRAST
TECHNIQUE: Multidetector CT imaging of the cervical spine was performed without
intravenous contrast. Multiplanar CT image reconstructions were also
generated.

[Series 4: c_spine 2.0 st · axial · 0.25mm/px · z∈[-150,-38]mm · 5 of 85 slices shown, 7 images]
[im 15/85  soft-tissue]
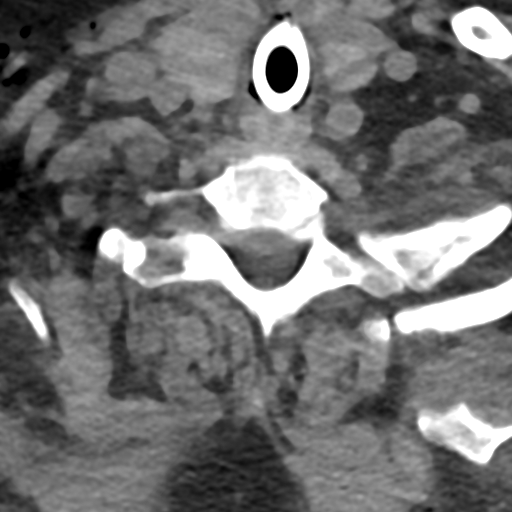
[im 15/85  bone]
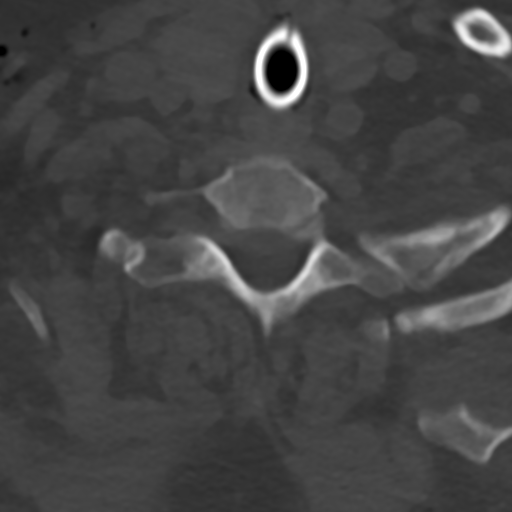
[im 29/85  bone]
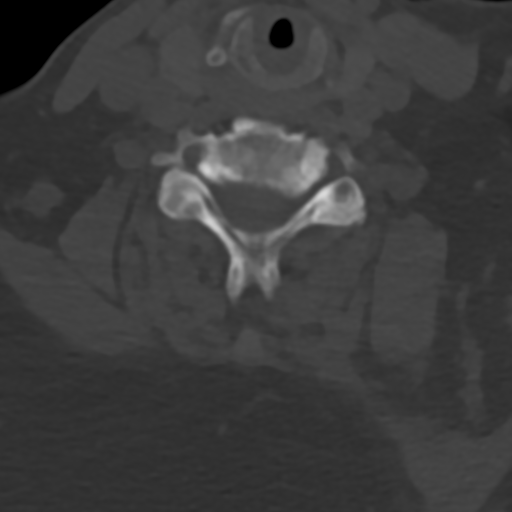
[im 43/85  bone]
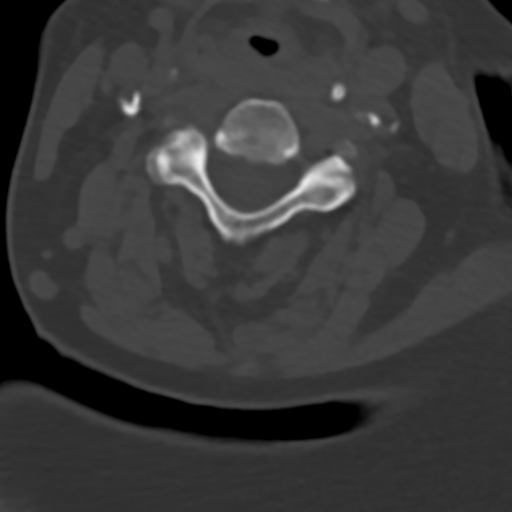
[im 57/85  bone]
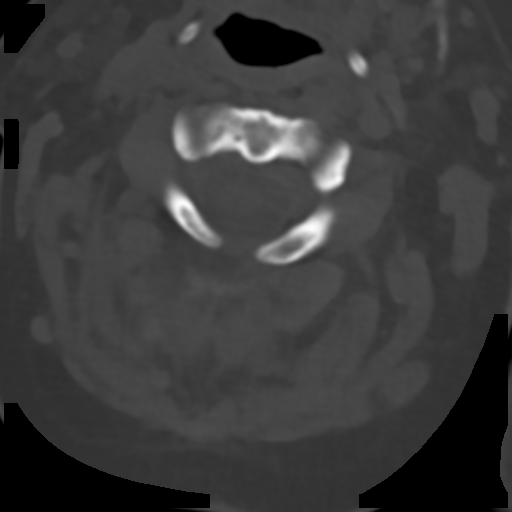
[im 71/85  soft-tissue]
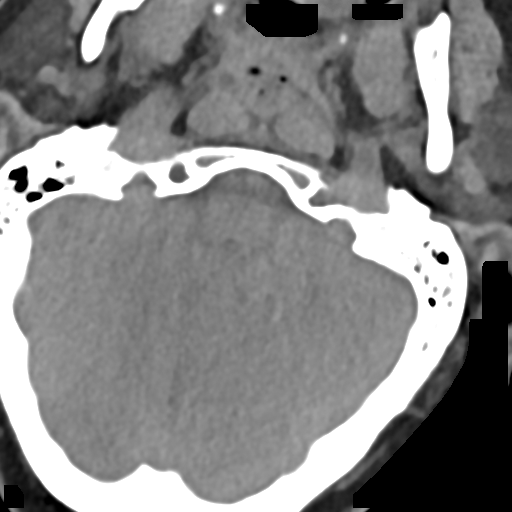
[im 71/85  bone]
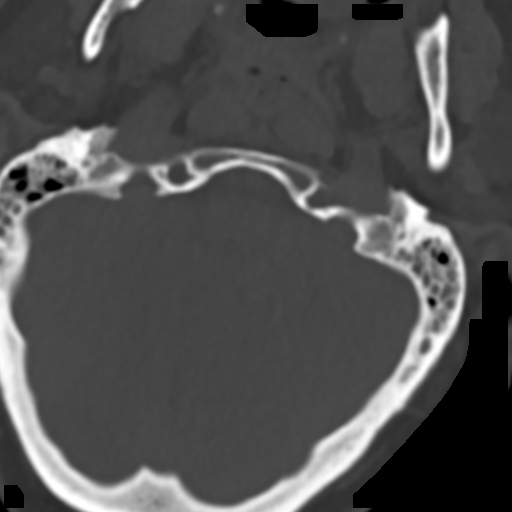

[Series 6: c_spine 2.0 sag bone · sagittal · 0.29mm/px · 5 of 61 slices shown, 6 images]
[im 21/61  bone]
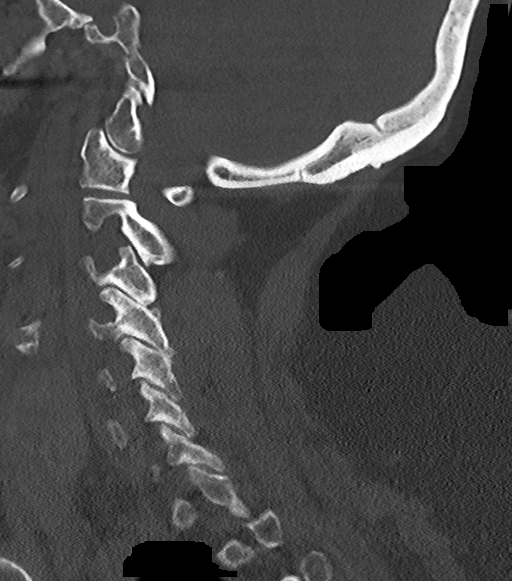
[im 26/61  bone]
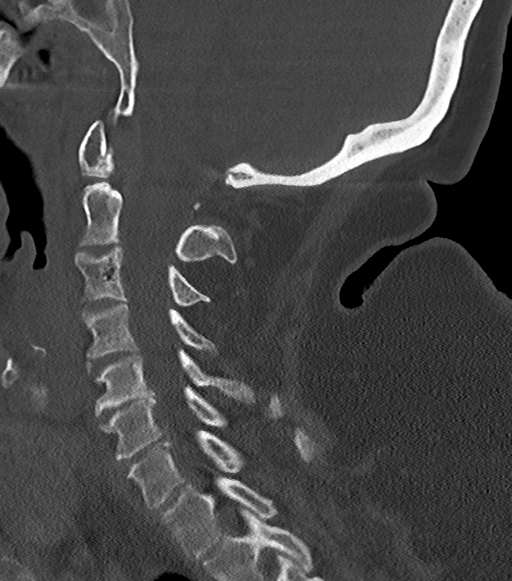
[im 31/61  soft-tissue]
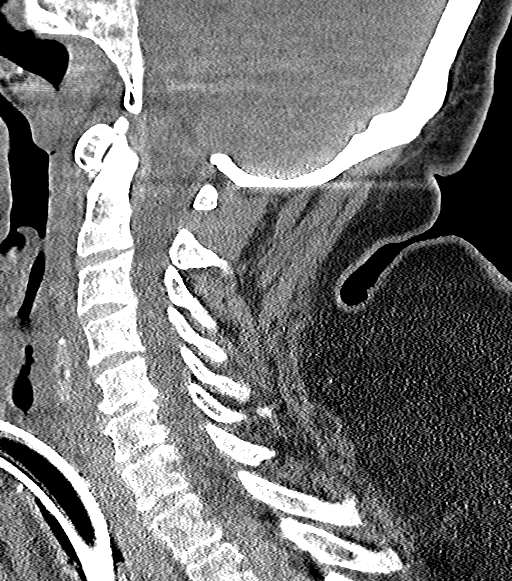
[im 31/61  bone]
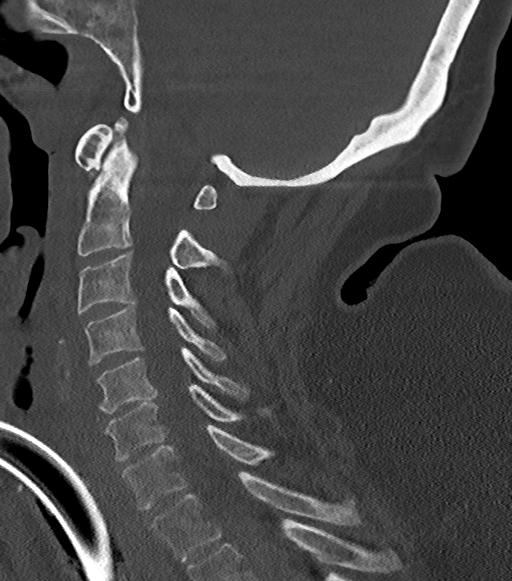
[im 36/61  bone]
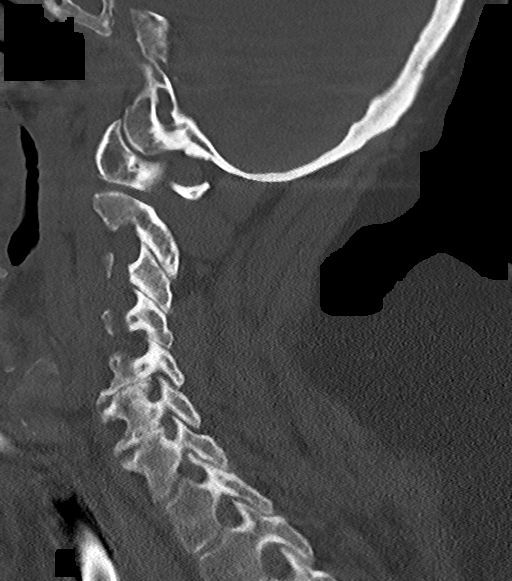
[im 41/61  bone]
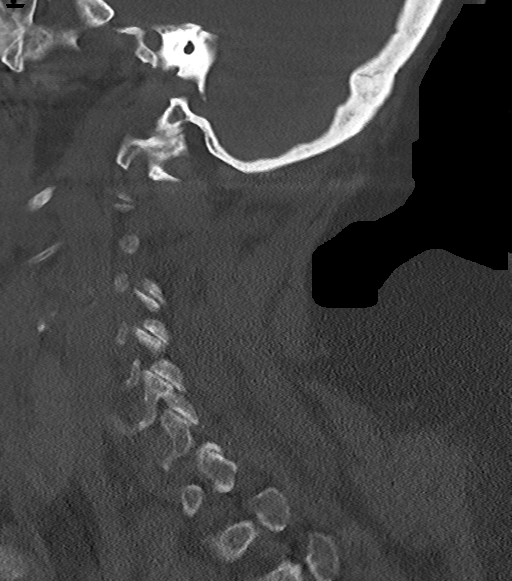

[Series 7: c_spine 2.0 cor bone · coronal · 0.25mm/px · 3 of 61 slices shown]
[im 13/61  bone]
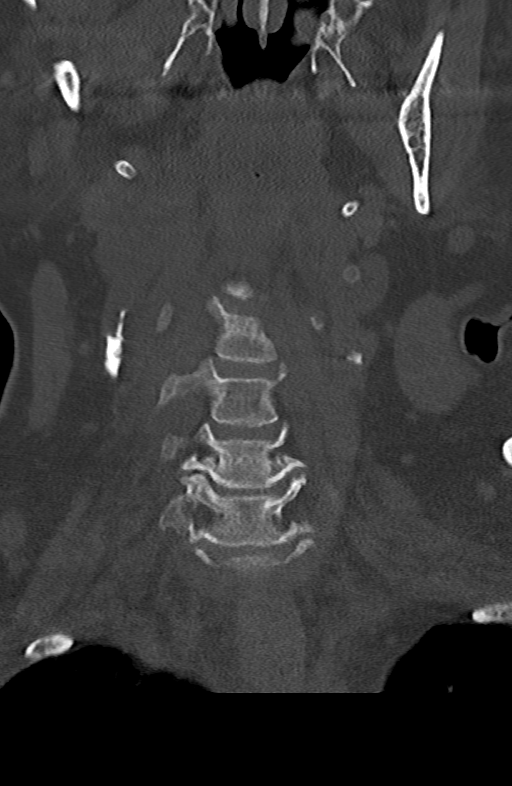
[im 25/61  bone]
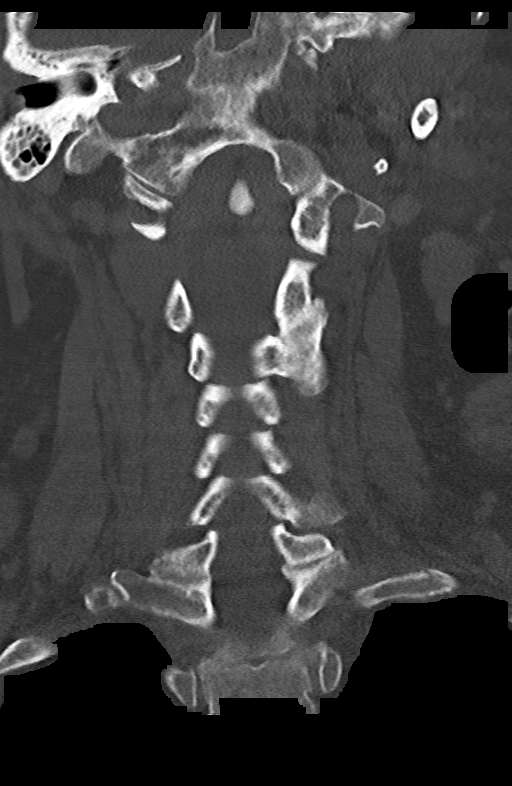
[im 37/61  bone]
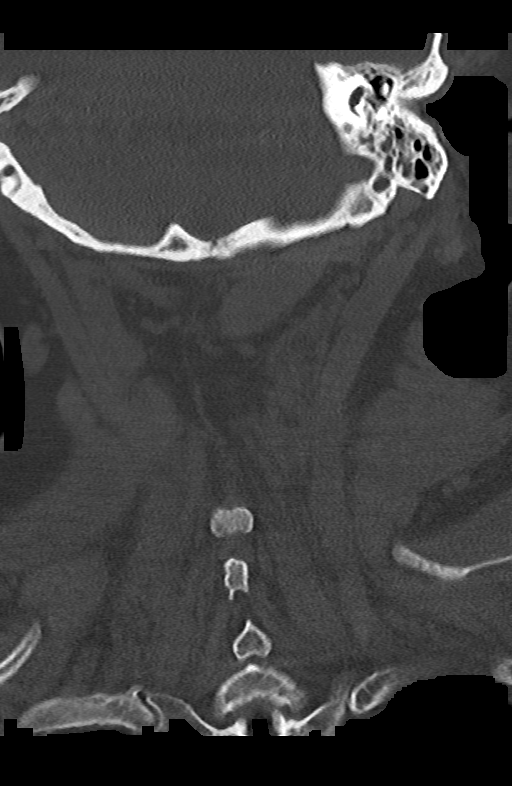

[13 of 33 positions shown; findings below may reference images not displayed]

FINDINGS: ALIGNMENT: Maintained lordosis. Vertebral bodies in alignment.

SKULL BASE AND VERTEBRAE: Cervical vertebral bodies and posterior
elements are intact. Moderate C5-6 and mild C6-7 disc height loss
with uncovertebral hypertrophy and endplate spurring compatible with
degenerative discs. Mild to moderate facet arthropathy. No
destructive bony lesions. C1-2 articulation maintained with moderate
arthropathy. No destructive bony lesions. C1-2 articulation
maintained. Acute nondisplaced LEFT first rib fracture.

SOFT TISSUES AND SPINAL CANAL: Normal.

DISC LEVELS: No significant osseous canal stenosis. Moderate RIGHT
C3-4, bilateral C5-6 and C6-7 neural foraminal narrowing.

UPPER CHEST: Lung apices are clear.

OTHER: Included view of the head demonstrates RIGHT temporal
occipital lobe suspected infarct . Bilateral mastoid effusions.
Severe sphenoid sinusitis. Tracheostomy tube tube in place. RIGHT
anterior chest wall subcutaneous emphysema.
IMPRESSION: No acute fracture or malalignment.

Acute LEFT first rib fracture. RIGHT chest wall subcutaneous
emphysema. Recommend CT chest for further characterization.

Suspected RIGHT temporal occipital lobe infarct, recommend CT HEAD.
